# Patient Record
Sex: Male | Born: 1975 | Race: White | Hispanic: No | Smoking: Current every day smoker
Health system: Southern US, Community
[De-identification: ages and names within clinical notes are randomized; demographics above are authoritative.]

## PROBLEM LIST (undated history)

## (undated) DIAGNOSIS — B1920 Unspecified viral hepatitis C without hepatic coma: Secondary | ICD-10-CM

## (undated) DIAGNOSIS — F1920 Other psychoactive substance dependence, uncomplicated: Secondary | ICD-10-CM

## (undated) DIAGNOSIS — A4902 Methicillin resistant Staphylococcus aureus infection, unspecified site: Secondary | ICD-10-CM

## (undated) HISTORY — PX: ABCESS DRAINAGE: SHX399

---

## 2018-03-12 ENCOUNTER — Emergency Department (HOSPITAL_COMMUNITY): Payer: Self-pay

## 2018-03-12 ENCOUNTER — Other Ambulatory Visit: Payer: Self-pay

## 2018-03-12 ENCOUNTER — Encounter (HOSPITAL_COMMUNITY): Payer: Self-pay | Admitting: *Deleted

## 2018-03-12 ENCOUNTER — Emergency Department (HOSPITAL_COMMUNITY)
Admission: EM | Admit: 2018-03-12 | Discharge: 2018-03-12 | Disposition: A | Discharge status: Home / Self Care | Payer: Self-pay | Attending: Emergency Medicine | Admitting: Emergency Medicine

## 2018-03-12 DIAGNOSIS — K047 Periapical abscess without sinus: Secondary | ICD-10-CM | POA: Insufficient documentation

## 2018-03-12 DIAGNOSIS — F17210 Nicotine dependence, cigarettes, uncomplicated: Secondary | ICD-10-CM | POA: Insufficient documentation

## 2018-03-12 DIAGNOSIS — K029 Dental caries, unspecified: Secondary | ICD-10-CM | POA: Insufficient documentation

## 2018-03-12 HISTORY — DX: Other psychoactive substance dependence, uncomplicated: F19.20

## 2018-03-12 HISTORY — DX: Unspecified viral hepatitis C without hepatic coma: B19.20

## 2018-03-12 HISTORY — DX: Methicillin resistant Staphylococcus aureus infection, unspecified site: A49.02

## 2018-03-12 LAB — CBC WITH DIFFERENTIAL/PLATELET
BASOS PCT: 0 %
Basophils Absolute: 0 10*3/uL (ref 0.0–0.1)
EOS ABS: 0.1 10*3/uL (ref 0.0–0.7)
EOS PCT: 1 %
HCT: 39.1 % (ref 39.0–52.0)
Hemoglobin: 12.8 g/dL — ABNORMAL LOW (ref 13.0–17.0)
Lymphocytes Relative: 17 %
Lymphs Abs: 1.9 10*3/uL (ref 0.7–4.0)
MCH: 29.8 pg (ref 26.0–34.0)
MCHC: 32.7 g/dL (ref 30.0–36.0)
MCV: 90.9 fL (ref 78.0–100.0)
MONO ABS: 0.8 10*3/uL (ref 0.1–1.0)
MONOS PCT: 7 %
Neutro Abs: 8.6 10*3/uL — ABNORMAL HIGH (ref 1.7–7.7)
Neutrophils Relative %: 75 %
Platelets: 223 10*3/uL (ref 150–400)
RBC: 4.3 MIL/uL (ref 4.22–5.81)
RDW: 13.5 % (ref 11.5–15.5)
WBC: 11.4 10*3/uL — ABNORMAL HIGH (ref 4.0–10.5)

## 2018-03-12 LAB — COMPREHENSIVE METABOLIC PANEL
ALBUMIN: 3.8 g/dL (ref 3.5–5.0)
ALK PHOS: 64 U/L (ref 38–126)
ALT: 44 U/L (ref 17–63)
AST: 51 U/L — AB (ref 15–41)
Anion gap: 10 (ref 5–15)
BUN: 9 mg/dL (ref 6–20)
CALCIUM: 8.6 mg/dL — AB (ref 8.9–10.3)
CO2: 21 mmol/L — AB (ref 22–32)
CREATININE: 0.88 mg/dL (ref 0.61–1.24)
Chloride: 103 mmol/L (ref 101–111)
GFR calc Af Amer: >60 mL/min (ref >60)
GFR calc non Af Amer: >60 mL/min (ref >60)
GLUCOSE: 99 mg/dL (ref 65–99)
Potassium: 3.2 mmol/L — ABNORMAL LOW (ref 3.5–5.1)
SODIUM: 134 mmol/L — AB (ref 135–145)
Total Bilirubin: 1.5 mg/dL — ABNORMAL HIGH (ref 0.3–1.2)
Total Protein: 7.1 g/dL (ref 6.5–8.1)

## 2018-03-12 LAB — I-STAT CG4 LACTIC ACID, ED: Lactic Acid, Venous: 0.81 mmol/L (ref 0.5–1.9)

## 2018-03-12 MED ORDER — IBUPROFEN 400 MG PO TABS
600.0000 mg | ORAL_TABLET | Freq: Once | ORAL | Status: AC
  Administered 2018-03-12: 20:00:00 600 mg via ORAL
  Filled 2018-03-12: qty 1

## 2018-03-12 MED ORDER — CLINDAMYCIN HCL 150 MG PO CAPS
450.0000 mg | ORAL_CAPSULE | Freq: Once | ORAL | Status: AC
  Administered 2018-03-12: 450 mg via ORAL
  Filled 2018-03-12: qty 3

## 2018-03-12 MED ORDER — CLINDAMYCIN HCL 300 MG PO CAPS: 300.0000 mg | ORAL_CAPSULE | Freq: Three times a day (TID) | ORAL | 0 refills | Status: AC

## 2018-03-12 NOTE — ED Provider Notes (Signed)
[facility] EMERGENCY DEPARTMENT Provider Note   CSN: [id number] Arrival date & time: 03/12/18  1759     History   Chief Complaint Chief Complaint  Patient presents with  . Dental Pain  . Fever    HPI Nathaniel Livingston is a 42 y.o. male.  The history is provided by the patient.  Dental Pain   This is a recurrent problem. The current episode started 2 days ago. The problem occurs constantly. The problem has been gradually worsening. The pain is moderate.  Patient states he has recurring dental infections.  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  He states 3 days ago he pulled out 1 of his teeth because it was infected and now it has spread to other teeth.  Patient primarily complaining of left upper dental pain at this time.  He states he would also like help for substance abuse.  Past Medical History:  Diagnosis Date  . Drug addiction (HCC)   . Hepatitis C   . MRSA (methicillin resistant Staphylococcus aureus)     There are no active problems to display for this patient.   History reviewed. No pertinent surgical history.     Home Medications    Prior to Admission medications   Medication Sig Start Date End Date Taking? Authorizing Provider  clindamycin (CLEOCIN) 300 MG capsule Take 1 capsule (300 mg total) by mouth 3 (three) times daily for 10 days. 03/12/18 03/22/18  Dwana Melena, DO    Family History No family history on file.  Social History Social History   Tobacco Use  . Smoking status: Current Every Day Smoker    Packs/day: 3.00    Types: Cigarettes  . Smokeless tobacco: Never Used  Substance Use Topics  . Alcohol use: Yes  . Drug use: Yes    Types: Methamphetamines, Cocaine, Marijuana     Allergies   Sulfa antibiotics   Review of Systems Review of Systems  Constitutional: Negative for chills and fever.  HENT: Positive for dental problem. Negative for congestion and sore throat.   Eyes: Negative for  pain and visual disturbance.  Respiratory: Negative for cough and shortness of breath.   Cardiovascular: Negative for chest pain and palpitations.  Gastrointestinal: Negative for abdominal pain, nausea and vomiting.  Genitourinary: Negative for dysuria.  Musculoskeletal: Negative for arthralgias and back pain.  Skin: Negative for color change and rash.  Neurological: Negative for headaches.  All other systems reviewed and are negative.    Physical Exam Updated Vital Signs Pulse 97   Temp 98.8 F (37.1 C) (Oral)   Resp 16   Ht 6\' 1"  (1.854 m)   Wt 99.8 kg (220 lb)   SpO2 100%   BMI 29.03 kg/m   Physical Exam  Constitutional: He is oriented to person, place, and time. He appears well-developed and well-nourished.  HENT:  Head: Normocephalic and atraumatic.  Mouth/Throat: Mucous membranes are normal. No trismus in the jaw. Abnormal dentition. Dental abscesses and dental caries present. No uvula swelling. No tonsillar abscesses.    Eyes: Conjunctivae are normal.  Neck: Neck supple.  Cardiovascular: Normal rate and regular rhythm.  Pulmonary/Chest: Effort normal and breath sounds normal.  Abdominal: Soft. There is no tenderness.  Musculoskeletal: He exhibits no edema.  Neurological: He is alert and oriented to person, place, and time.  Skin: Skin is warm and dry.  Psychiatric: He has a normal mood and affect. He is hyperactive.  Nursing note and vitals reviewed.  ED Treatments / Results  Labs (all labs ordered are listed, but only abnormal results are displayed) Labs Reviewed  COMPREHENSIVE METABOLIC PANEL - Abnormal; Notable for the following components:      Result Value   Sodium 134 (*)    Potassium 3.2 (*)    CO2 21 (*)    Calcium 8.6 (*)    AST 51 (*)    Total Bilirubin 1.5 (*)    All other components within normal limits  CBC WITH DIFFERENTIAL/PLATELET - Abnormal; Notable for the following components:   WBC 11.4 (*)    Hemoglobin 12.8 (*)    Neutro Abs  8.6 (*)    All other components within normal limits  URINALYSIS, ROUTINE W REFLEX MICROSCOPIC  I-STAT CG4 LACTIC ACID, ED    EKG  EKG Interpretation None       Radiology No results found.  Procedures Procedures (including critical care time)  Medications Ordered in ED Medications  clindamycin (CLEOCIN) capsule 450 mg (not administered)  ibuprofen (ADVIL,MOTRIN) tablet 600 mg (not administered)     Initial Impression / Assessment and Plan / ED Course  I have reviewed the triage vital signs and the nursing notes.  Pertinent labs & imaging results that were available during my care of the patient were reviewed by me and considered in my medical decision making (see chart for details).     Patient is a 42 year old male who presents with dental pain and fever.  Reported fever of 101 with EMS, however he is afebrile here.  Patient does admit to recent methamphetamine use and does want help with substance abuse.  Patient has recurring dental infections per the patient.  On exam he does have multiple dental caries and is missing several teeth.  He has a mild dental abscess on the left upper incisor.  There is no facial swelling or neck swelling or signs of Ludwig angina.  He has no trismus.    Labs showed leukocytosis of 11.4, mild hyponatremia and hyperkalemia.  No need for intervention at this time.  Doubt sepsis.  Chest x-ray does not show any obvious signs of pneumonia.  No urinary symptoms.  No GI symptoms.  First dose of clindamycin given here for odontogenic infection.  Along with ibuprofen.  10 days of clindamycin prescribed.  Resources for dental clinics around town and substance abuse treatment centers given in discharge paperwork.  Patient to follow-up with dentist as soon as possible.  Patient discharged in good condition.  Doubt sepsis or any other infection at this time.  Final Clinical Impressions(s) / ED Diagnoses   Final diagnoses:  Pain due to dental caries    Dental abscess    ED Discharge Orders        Ordered    clindamycin (CLEOCIN) 300 MG capsule  3 times daily     03/12/18 1924       Dwana MelenaDong, Zakia Sainato, DO 03/12/18 1931    Mesner, Barbara CowerJason, MD 03/12/18 1945

## 2018-03-12 NOTE — ED Notes (Signed)
Pt verbalized understanding of d/c instructions and has no further questions, VSS, NAD.  

## 2018-03-12 NOTE — ED Triage Notes (Signed)
Pt here via [facility] after c/o "feeling terrible".  Pt last used meth yesterday. States pain to legs, nausea, loose teeth, swelling behind ear and below jaw.  Pt given 700 ns and 4 iv zofran en-route.  EMS stated temp 101 temporal, but 98.8 in triage.

## 2018-03-13 ENCOUNTER — Telehealth: Payer: Self-pay | Admitting: *Deleted

## 2018-03-13 NOTE — Telephone Encounter (Signed)
Pt calling stating Rx prescribed is too expensive and he can not afford them.  NCM searched [employer] for an affordable coupon.  NCM text coupon to pt phone and stayed online until it was received.  Pt very appreciative.

## 2018-12-26 ENCOUNTER — Other Ambulatory Visit: Payer: Self-pay

## 2018-12-26 ENCOUNTER — Emergency Department
Admission: EM | Admit: 2018-12-26 | Discharge: 2018-12-27 | Disposition: A | Discharge status: Home / Self Care | Payer: Self-pay | Attending: Emergency Medicine | Admitting: Emergency Medicine

## 2018-12-26 DIAGNOSIS — L7622 Postprocedural hemorrhage and hematoma of skin and subcutaneous tissue following other procedure: Secondary | ICD-10-CM | POA: Insufficient documentation

## 2018-12-26 DIAGNOSIS — F17210 Nicotine dependence, cigarettes, uncomplicated: Secondary | ICD-10-CM | POA: Insufficient documentation

## 2018-12-26 DIAGNOSIS — R58 Hemorrhage, not elsewhere classified: Secondary | ICD-10-CM

## 2018-12-26 NOTE — ED Triage Notes (Signed)
Pt to the er for bleeding to the right axillae post surgery. Pt had a drainage until tonight when it turned to bleeding when he showered.

## 2018-12-27 ENCOUNTER — Emergency Department: Payer: Self-pay

## 2018-12-27 ENCOUNTER — Encounter: Payer: Self-pay | Admitting: Radiology

## 2018-12-27 LAB — CBC
HCT: 43.4 % (ref 39.0–52.0)
HEMOGLOBIN: 14.9 g/dL (ref 13.0–17.0)
MCH: 30.7 pg (ref 26.0–34.0)
MCHC: 34.3 g/dL (ref 30.0–36.0)
MCV: 89.3 fL (ref 80.0–100.0)
NRBC: 0 % (ref 0.0–0.2)
PLATELETS: 355 10*3/uL (ref 150–400)
RBC: 4.86 MIL/uL (ref 4.22–5.81)
RDW: 12.8 % (ref 11.5–15.5)
WBC: 11.2 10*3/uL — ABNORMAL HIGH (ref 4.0–10.5)

## 2018-12-27 LAB — COMPREHENSIVE METABOLIC PANEL
ALK PHOS: 63 U/L (ref 38–126)
ALT: 35 U/L (ref 0–44)
AST: 24 U/L (ref 15–41)
Albumin: 3.7 g/dL (ref 3.5–5.0)
Anion gap: 10 (ref 5–15)
BUN: 15 mg/dL (ref 6–20)
CHLORIDE: 103 mmol/L (ref 98–111)
CO2: 22 mmol/L (ref 22–32)
CREATININE: 0.77 mg/dL (ref 0.61–1.24)
Calcium: 8.9 mg/dL (ref 8.9–10.3)
GFR calc Af Amer: >60 mL/min (ref >60)
Glucose, Bld: 118 mg/dL — ABNORMAL HIGH (ref 70–99)
Potassium: 3.6 mmol/L (ref 3.5–5.1)
Sodium: 135 mmol/L (ref 135–145)
Total Bilirubin: 0.7 mg/dL (ref 0.3–1.2)
Total Protein: 7.8 g/dL (ref 6.5–8.1)

## 2018-12-27 MED ORDER — LIDOCAINE HCL (PF) 1 % IJ SOLN
INTRAMUSCULAR | Status: AC
  Administered 2018-12-27: 5 mL via INTRADERMAL
  Filled 2018-12-27: qty 5

## 2018-12-27 MED ORDER — IOHEXOL 350 MG/ML SOLN
75.0000 mL | Freq: Once | INTRAVENOUS | Status: AC | PRN
  Administered 2018-12-27: 75 mL via INTRAVENOUS

## 2018-12-27 MED ORDER — LIDOCAINE HCL (PF) 1 % IJ SOLN
5.0000 mL | Freq: Once | INTRAMUSCULAR | Status: AC
  Administered 2018-12-27: 5 mL via INTRADERMAL

## 2018-12-27 NOTE — ED Notes (Signed)
Pt verbalizes understanding of discharge instructions.

## 2018-12-27 NOTE — ED Notes (Signed)
Pt to CT

## 2018-12-27 NOTE — ED Notes (Signed)
Pt reports has a headache after having CT done reports pain 5/10, headache is steady "pressure like headache" reports feeling itching sensation in head and arms

## 2018-12-27 NOTE — ED Notes (Signed)
Dr. Brown at bedside

## 2018-12-27 NOTE — ED Notes (Signed)
Pt reports positive for MRSA, reports was admitted at [facility] and had abscess drained to right axilla reports today he took a shower and started to bleed. Pt denies any pain, pt talks in complete sentences no distress noted

## 2018-12-27 NOTE — ED Notes (Signed)
Signature pad did not work in room pt signed paper  

## 2018-12-27 NOTE — ED Notes (Signed)
Bleeding controlled after Dr. Manson Passey applied quick clot

## 2018-12-28 NOTE — ED Provider Notes (Signed)
[facility] Emergency Department Provider Note _____   First MD Initiated Contact with Patient 12/26/18 2359     (approximate)  I have reviewed the triage vital signs and the nursing notes.   HISTORY  Chief Complaint Wound Dehiscence   HPI Nathaniel Livingston is a 43 y.o. male with below list of chronic medical conditions including IV drug use presents to the emergency department with history of being admitted [facility] secondary to right axilla abscess for which I&D was performed by this "surgeons there".  Patient states that he was discharged [facility] yesterday and while taking a shower last night when he raise his right arm above his head he began to bleed from the surgical site.   Past Medical History:  Diagnosis Date  . Drug addiction (HCC)   . Hepatitis C   . MRSA (methicillin resistant Staphylococcus aureus)     There are no active problems to display for this patient.   Past Surgical History:  Procedure Laterality Date  . ABCESS DRAINAGE      Prior to Admission medications   Not on File    Allergies Sulfa antibiotics  History reviewed. No pertinent family history.  Social History Social History   Tobacco Use  . Smoking status: Current Every Day Smoker    Packs/day: 3.00    Types: Cigarettes  . Smokeless tobacco: Never Used  Substance Use Topics  . Alcohol use: Yes  . Drug use: Yes    Types: Methamphetamines    Review of Systems Constitutional: No fever/chills Eyes: No visual changes. ENT: No sore throat. Cardiovascular: Denies chest pain. Respiratory: Denies shortness of breath. Gastrointestinal: No abdominal pain.  No nausea, no vomiting.  No diarrhea.  No constipation. Genitourinary: Negative for dysuria. Musculoskeletal: Negative for neck pain.  Negative for back pain.  Bleeding from surgical site right axilla Integumentary: Negative for rash. Neurological: Negative for headaches, focal weakness or  numbness.   ____________________________________________   PHYSICAL EXAM:  VITAL SIGNS: ED Triage Vitals  Enc Vitals Group     BP 12/26/18 2233 (!) 157/98     Pulse Rate 12/26/18 2233 91     Resp 12/26/18 2233 16     Temp 12/26/18 2233 98.5 F (36.9 C)     Temp Source 12/26/18 2233 Oral     SpO2 12/26/18 2233 98 %     Weight 12/26/18 2233 108.9 kg (240 lb)     Height 12/26/18 2233 1.854 m (6\' 1" )     Head Circumference --      Peak Flow --      Pain Score 12/26/18 2241 0     Pain Loc --      Pain Edu? --      Excl. in GC? --     Constitutional: Alert and oriented. Well appearing and in no acute distress. Eyes: Conjunctivae are normal.  Mouth/Throat: Mucous membranes are moist.  Oropharynx non-erythematous. Neck: No stridor.   Cardiovascular: Normal rate, regular rhythm. Good peripheral circulation. Grossly normal heart sounds. Respiratory: Normal respiratory effort.  No retractions. Lungs CTAB. Gastrointestinal: Soft and nontender. No distention.    Musculoskeletal: No lower extremity tenderness nor edema. No gross deformities of extremities. Neurologic:  Normal speech and language. No gross focal neurologic deficits are appreciated.  Skin: 2 open wounds noted right axilla consistent with history of I&D superior wound with active bleeding which appears venous in nature.  Moderate bleeding noted Psychiatric: Mood and affect are normal.  Speech and behavior are normal.  ____________________________________________   LABS (all labs ordered are listed, but only abnormal results are displayed)  Labs Reviewed  CBC - Abnormal; Notable for the following components:      Result Value   WBC 11.2 (*)    All other components within normal limits  COMPREHENSIVE METABOLIC PANEL - Abnormal; Notable for the following components:   Glucose, Bld 118 (*)    All other components within normal limits    RADIOLOGY {**I, [place] N BROWN, personally viewed and evaluated these images  (plain radiographs) as part of my medical decision making, as well as reviewing the written report by the radiologist.     ..Laceration Repair Date/Time: 12/28/2018 7:31 AM Performed by: Darci CurrentBrown, [place] N, MD Authorized by: Darci CurrentBrown, [place] N, MD   Consent:    Consent obtained:  Verbal   Consent given by:  Patient   Risks discussed:  Infection, pain, retained foreign body, poor cosmetic result and poor wound healing Anesthesia (see MAR for exact dosages):    Anesthesia method:  Local infiltration   Local anesthetic:  Lidocaine 1% w/o epi Laceration details:    Location:  Shoulder/arm (Right axilla) Repair type:    Repair type:  Simple Exploration:    Hemostasis achieved with:  Direct pressure   Wound exploration: entire depth of wound probed and visualized     Contaminated: no   Treatment:    Area cleansed with:  Saline   Amount of cleaning:  Extensive   Irrigation solution:  Sterile saline   Visualized foreign bodies/material removed: no   Skin repair:    Repair method:  Sutures   Suture size:  5-0   Suture material:  Fast-absorbing gut   Number of sutures:  2 Approximation:    Approximation:  Close Post-procedure details:    Dressing:  Sterile dressing   Patient tolerance of procedure:  Tolerated well, no immediate complications     ____________________________________________   INITIAL IMPRESSION / ASSESSMENT AND PLAN / ED COURSE  As part of my medical decision making, I reviewed the following data within the electronic MEDICAL RECORD NUMBER  Direct pressure was applied to the wound however patient continued to bleed and as such CT angios performed to evaluate for underlying vascular injury.  CT angiogram revealed no vascular damage however foci of air noted which I suspect to be secondary to the open wounds that the patient has.  Culture results were reviewed from [facility]. Patient's axilla wound that is actively bleeding was cleaned with Betadine and subsequently  anesthetized with 1% lidocaine without epinephrine.  2 Vicryl stitches were introduced to obtain hemostasis subsequently Surgicel was applied to the wound with bleeding resolved.  Patient advised to follow-up with surgeon at [facility] today     ____________________________________________  FINAL CLINICAL IMPRESSION(S) / ED DIAGNOSES  Final diagnoses:  Bleeding  Postoperative bleeding   MEDICATIONS GIVEN DURING THIS VISIT:  Medications  lidocaine (PF) (XYLOCAINE) 1 % injection 5 mL (5 mLs Intradermal Given 12/27/18 0200)  iohexol (OMNIPAQUE) 350 MG/ML injection 75 mL (75 mLs Intravenous Contrast Given 12/27/18 0315)     ED Discharge Orders    None       Note:  This document was prepared using Dragon voice recognition software and may include unintentional dictation errors.    Darci CurrentBrown, [place] N, MD 12/28/18 [phone]

## 2020-05-03 IMAGING — CT CT ANGIO CHEST
2 of 6 series · 18 of 46 positions shown · IV contrast (APPLIED)
Comparison: None.

CLINICAL DATA: Status post right axillary abscess surgery 5 days
ago, with bleeding from the site. Initial encounter.

EXAM:
CT ANGIOGRAPHY CHEST WITH CONTRAST
TECHNIQUE: Multidetector CT imaging of the chest was performed using the
standard protocol during bolus administration of intravenous
contrast. Multiplanar CT image reconstructions and MIPs were
obtained to evaluate the vascular anatomy.
CONTRAST:  75mL OMNIPAQUE IOHEXOL 350 MG/ML SOLN

[Series 5: thins · axial · 0.83mm/px · z∈[-320,-47]mm · 15 of 299 slices shown]
[im 13/299  lung]
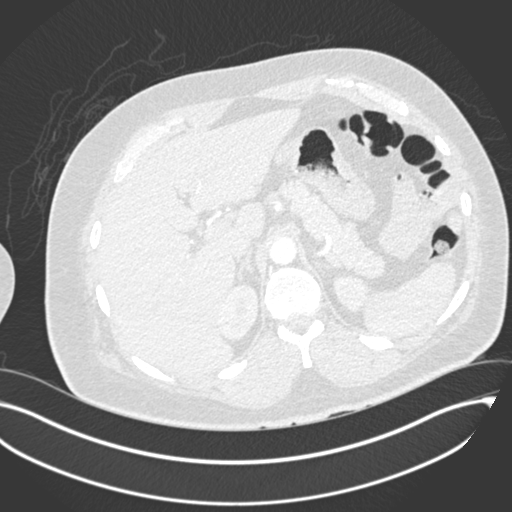
[im 39/299  soft-tissue]
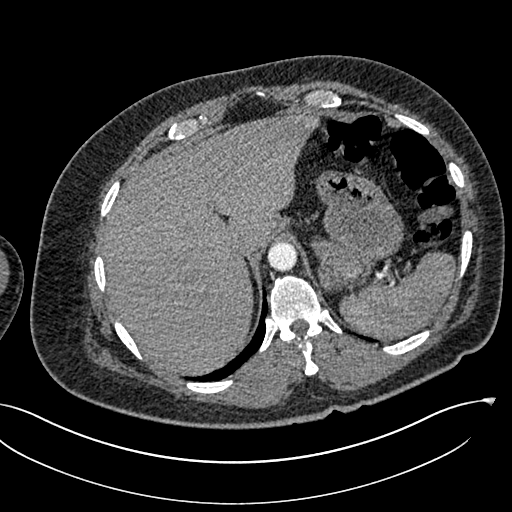
[im 52/299  lung]
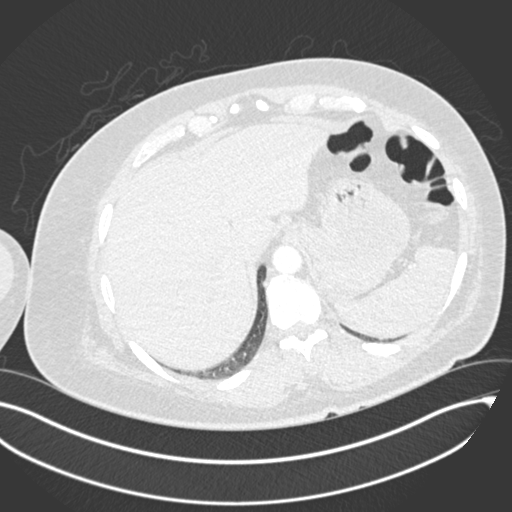
[im 78/299  soft-tissue]
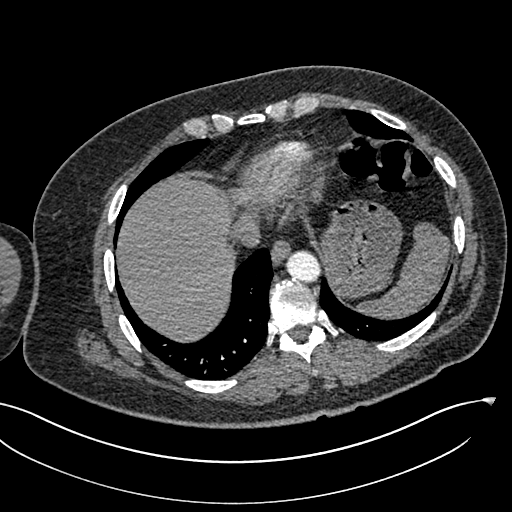
[im 91/299  lung]
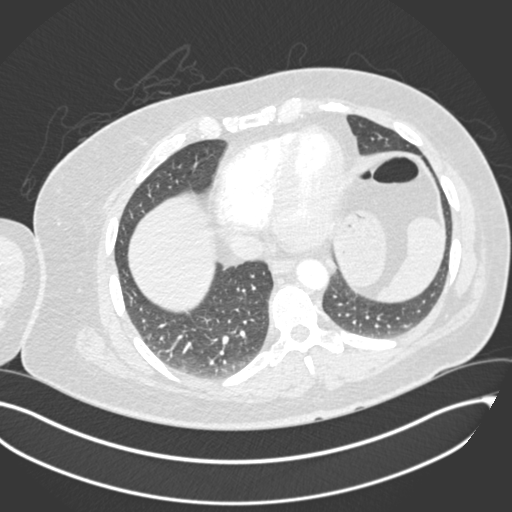
[im 117/299  soft-tissue]
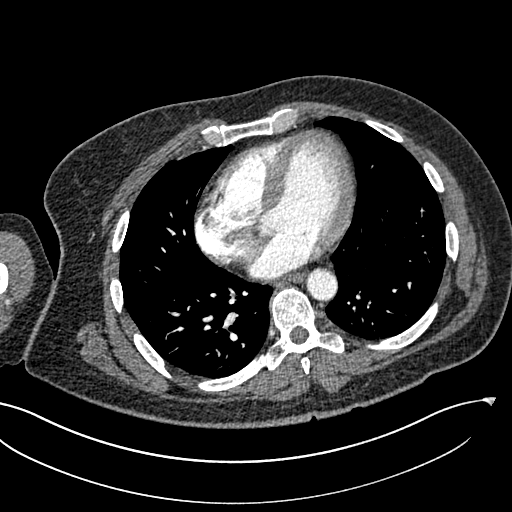
[im 130/299  lung]
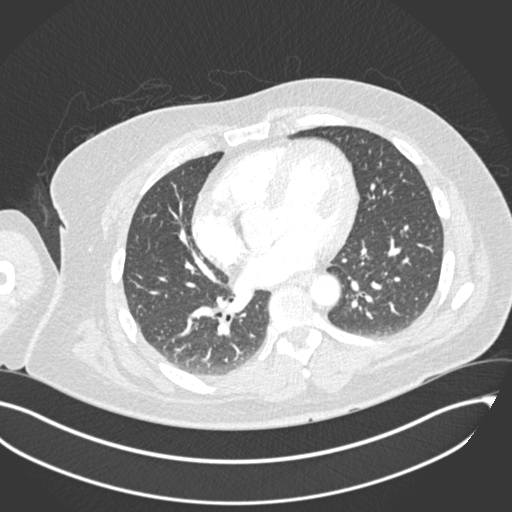
[im 156/299  soft-tissue]
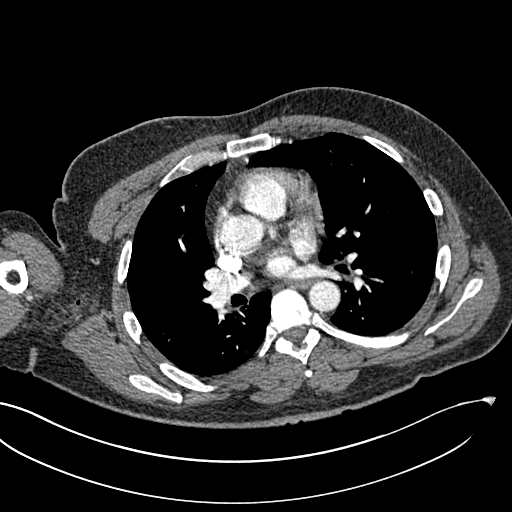
[im 169/299  lung]
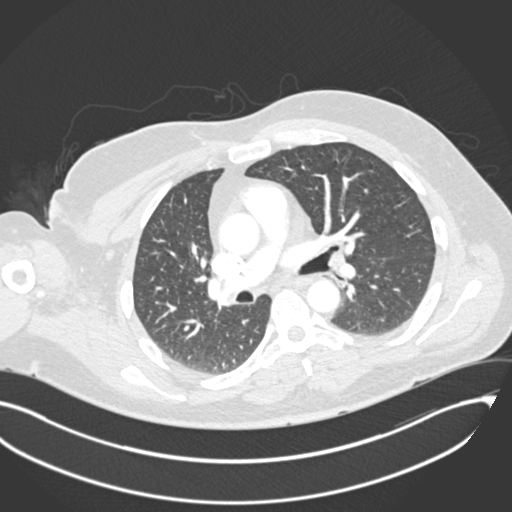
[im 182/299  soft-tissue]
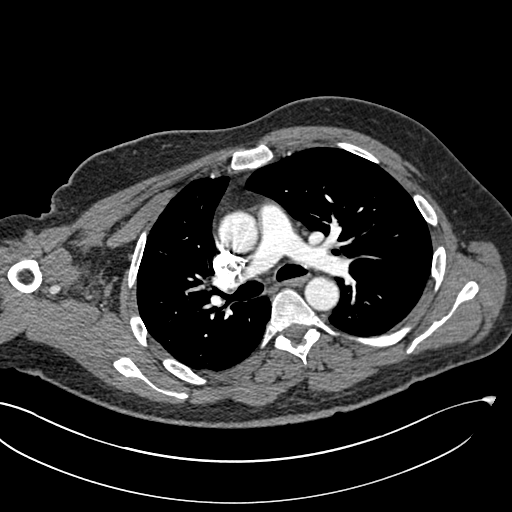
[im 208/299  lung]
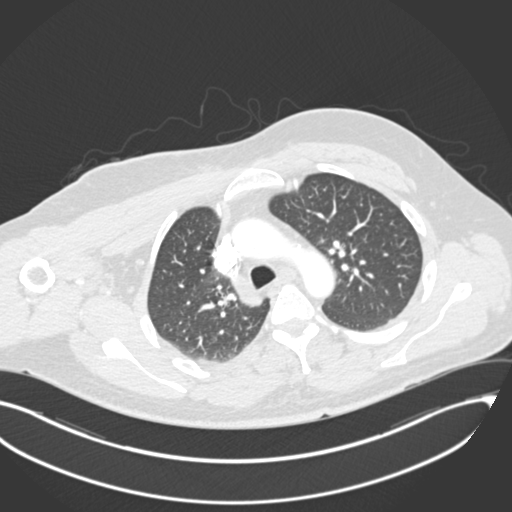
[im 221/299  soft-tissue]
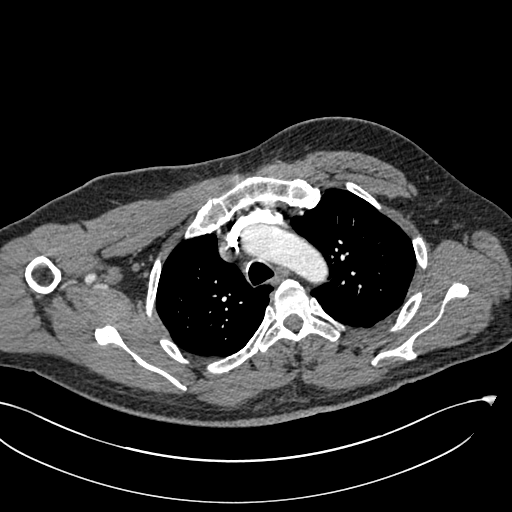
[im 247/299  lung]
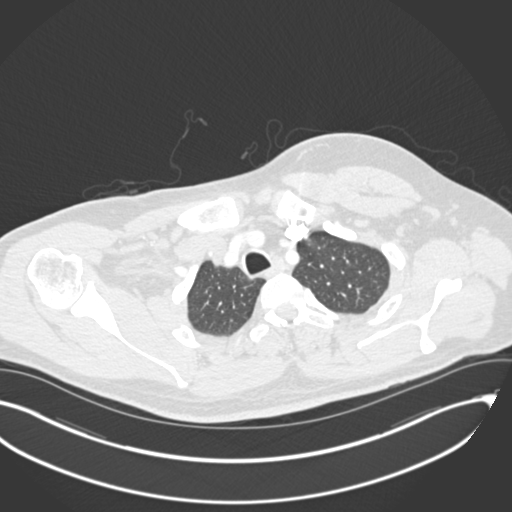
[im 260/299  soft-tissue]
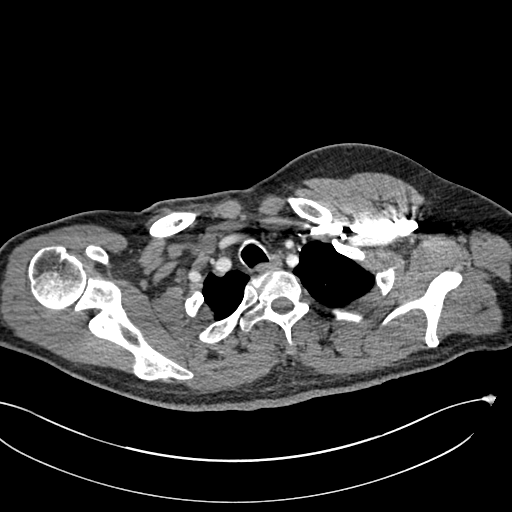
[im 286/299  lung]
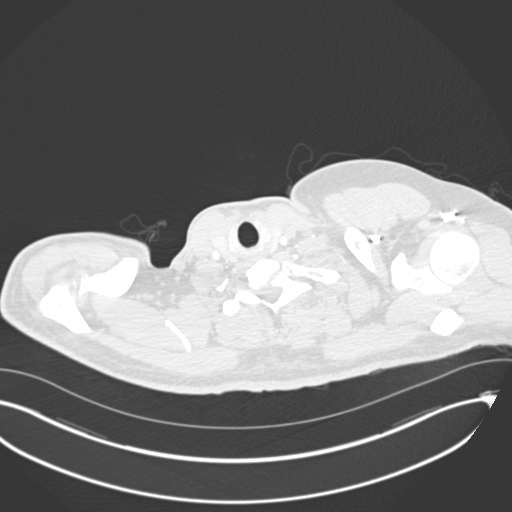

[Series 7: coronal mpr · coronal · 0.58mm/px · 3 of 89 slices shown]
[im 23/89  soft-tissue]
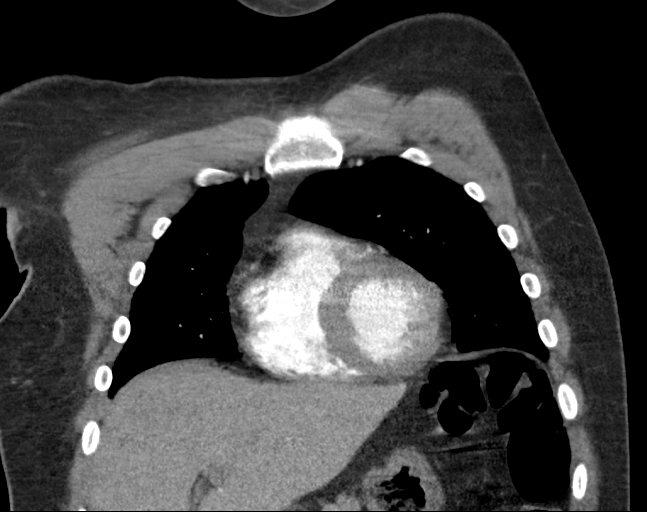
[im 45/89  soft-tissue]
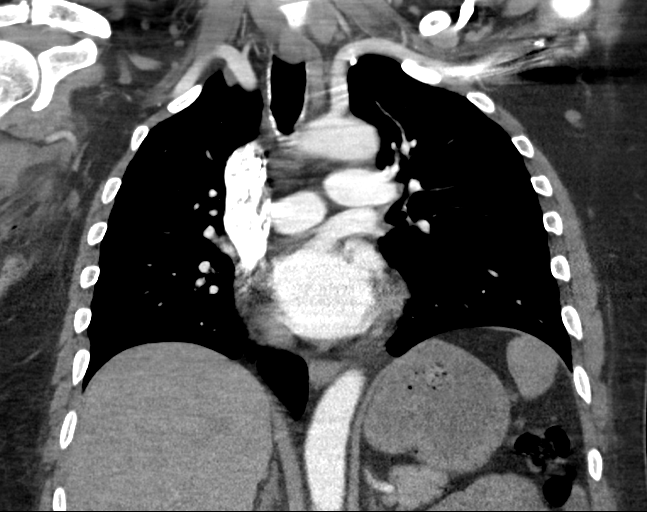
[im 67/89  soft-tissue]
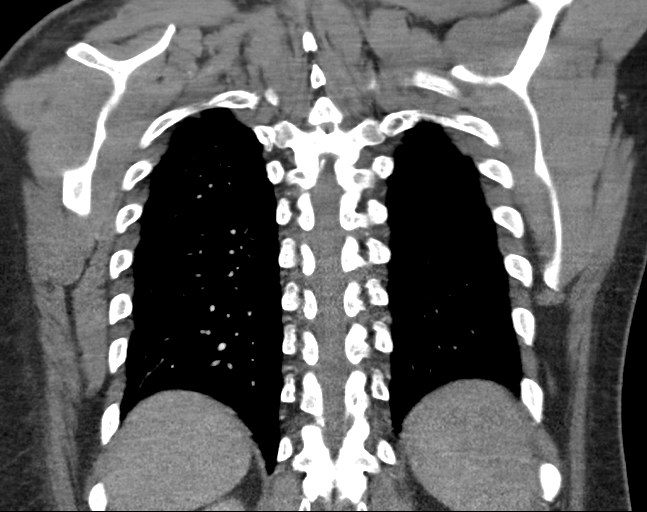

[18 of 46 positions shown; findings below may reference images not displayed]

FINDINGS: Cardiovascular:  There is no evidence of pulmonary embolus.

The heart is normal in size. The thoracic aorta is grossly
unremarkable. The great vessels are within normal limits.

Mediastinum/Nodes: The mediastinum is unremarkable in appearance. No
mediastinal lymphadenopathy is seen. No pericardial effusion is
identified. The visualized portions of the thyroid gland are
unremarkable. No axillary lymphadenopathy is seen.

A few scattered foci of air are seen within the soft tissues at the
right axilla, somewhat more than would be expected 5 days status
post surgery. Surrounding soft tissue inflammation is noted. No well
defined abscess is seen. Would correlate clinically for evidence of
infection with a gas producing organism.

Lungs/Pleura: The lungs are clear bilaterally. No focal
consolidation, pleural effusion or pneumothorax is seen. No masses
are identified.

Upper Abdomen: The visualized portions of the liver and spleen are
unremarkable. The visualized portions of the pancreas, adrenal
glands and kidneys are within normal limits.

Musculoskeletal: No acute osseous abnormalities are identified. The
visualized musculature is unremarkable in appearance.

Review of the MIP images confirms the above findings.
IMPRESSION: 1. No evidence of pulmonary embolus.
2. Few scattered foci of air within the soft tissues at the right
axilla, somewhat more than would be expected 5 days status post
surgery. Surrounding soft tissue inflammation noted. No well defined
abscess seen. Would correlate clinically for evidence of infection
with a gas producing organism.
3. Lungs clear bilaterally.

## 2020-11-03 ENCOUNTER — Emergency Department: Payer: Self-pay

## 2020-11-03 ENCOUNTER — Encounter: Payer: Self-pay | Admitting: Emergency Medicine

## 2020-11-03 ENCOUNTER — Other Ambulatory Visit: Payer: Self-pay

## 2020-11-03 DIAGNOSIS — Z23 Encounter for immunization: Secondary | ICD-10-CM | POA: Insufficient documentation

## 2020-11-03 DIAGNOSIS — F17210 Nicotine dependence, cigarettes, uncomplicated: Secondary | ICD-10-CM | POA: Insufficient documentation

## 2020-11-03 DIAGNOSIS — Y9289 Other specified places as the place of occurrence of the external cause: Secondary | ICD-10-CM | POA: Insufficient documentation

## 2020-11-03 DIAGNOSIS — Z8614 Personal history of Methicillin resistant Staphylococcus aureus infection: Secondary | ICD-10-CM | POA: Insufficient documentation

## 2020-11-03 DIAGNOSIS — S6701XA Crushing injury of right thumb, initial encounter: Secondary | ICD-10-CM | POA: Insufficient documentation

## 2020-11-03 DIAGNOSIS — W230XXA Caught, crushed, jammed, or pinched between moving objects, initial encounter: Secondary | ICD-10-CM | POA: Insufficient documentation

## 2020-11-03 DIAGNOSIS — S61011A Laceration without foreign body of right thumb without damage to nail, initial encounter: Secondary | ICD-10-CM | POA: Insufficient documentation

## 2020-11-03 DIAGNOSIS — Y999 Unspecified external cause status: Secondary | ICD-10-CM | POA: Insufficient documentation

## 2020-11-03 DIAGNOSIS — Y9389 Activity, other specified: Secondary | ICD-10-CM | POA: Insufficient documentation
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
## 2020-11-04 ENCOUNTER — Emergency Department
Admission: EM | Admit: 2020-11-04 | Discharge: 2020-11-04 | Disposition: A | Discharge status: Home / Self Care | Payer: Self-pay | Attending: Emergency Medicine | Admitting: Emergency Medicine

## 2020-11-04 DIAGNOSIS — S61011A Laceration without foreign body of right thumb without damage to nail, initial encounter: Secondary | ICD-10-CM

## 2020-11-04 DIAGNOSIS — S6710XA Crushing injury of unspecified finger(s), initial encounter: Secondary | ICD-10-CM

## 2020-11-04 MED ORDER — DOXYCYCLINE HYCLATE 100 MG PO TABS
100.0000 mg | ORAL_TABLET | Freq: Once | ORAL | Status: AC
  Administered 2020-11-04: 100 mg via ORAL
  Filled 2020-11-04: qty 1

## 2020-11-04 MED ORDER — CEPHALEXIN 500 MG PO CAPS
500.0000 mg | ORAL_CAPSULE | Freq: Once | ORAL | Status: AC
  Administered 2020-11-04: 500 mg via ORAL
  Filled 2020-11-04: qty 1

## 2020-11-04 MED ORDER — DOXYCYCLINE HYCLATE 100 MG PO CAPS: 100.0000 mg | ORAL_CAPSULE | Freq: Two times a day (BID) | ORAL | 0 refills | Status: AC

## 2020-11-04 MED ORDER — HYDROCODONE-ACETAMINOPHEN 5-325 MG PO TABS: 2.0000 | ORAL_TABLET | Freq: Four times a day (QID) | ORAL | 0 refills | Status: AC | PRN

## 2020-11-04 MED ORDER — LIDOCAINE HCL (PF) 1 % IJ SOLN
5.0000 mL | Freq: Once | INTRAMUSCULAR | Status: AC
  Administered 2020-11-04: 5 mL
  Filled 2020-11-04: qty 5

## 2020-11-04 MED ORDER — OXYCODONE-ACETAMINOPHEN 5-325 MG PO TABS
2.0000 | ORAL_TABLET | Freq: Once | ORAL | Status: AC
  Administered 2020-11-04: 2 via ORAL
  Filled 2020-11-04: qty 2

## 2020-11-04 MED ORDER — TETANUS-DIPHTH-ACELL PERTUSSIS 5-2.5-18.5 LF-MCG/0.5 IM SUSY
0.5000 mL | PREFILLED_SYRINGE | Freq: Once | INTRAMUSCULAR | Status: AC
  Administered 2020-11-04: 0.5 mL via INTRAMUSCULAR
  Filled 2020-11-04: qty 0.5

## 2020-11-04 MED ORDER — CEPHALEXIN 500 MG PO CAPS: 500.0000 mg | ORAL_CAPSULE | Freq: Four times a day (QID) | ORAL | 0 refills | Status: AC

## 2020-11-04 NOTE — Discharge Instructions (Signed)
As we discussed, given the nature of your wounds, we left the lacerations to your thumb open and provided a splint for comfort and protection of the thumb.  It is important that you contact orthopedic surgery, and if possible, Dr. Stephenie Acres (the local hand specialist), to schedule a follow-up appointment at the next available opportunity.  Keep your splint clean and dry.  Take the prescribed course of antibiotics unless told to stop by another physician.  Use over-the-counter ibuprofen and/or Tylenol as needed for pain control. Take Norco as prescribed for severe pain. Do not drink alcohol, drive or participate in any other potentially dangerous activities while taking this medication as it may make you sleepy. Do not take this medication with any other sedating medications, either prescription or over-the-counter. If you were prescribed Percocet or Vicodin, do not take these with acetaminophen (Tylenol) as it is already contained within these medications.   This medication is an opiate (or narcotic) pain medication and can be habit forming.  Use it as little as possible to achieve adequate pain control.  Do not use or use it with extreme caution if you have a history of opiate abuse or dependence.  If you are on a pain contract with your primary care doctor or a pain specialist, be sure to let them know you were prescribed this medication today from the [facility] Emergency Department.  This medication is intended for your use only - do not give any to anyone else and keep it in a secure place where nobody else, especially children, have access to it.  It will also cause or worsen constipation, so you may want to consider taking an over-the-counter stool softener while you are taking this medication.    Return to the emergency department if you develop new or worsening symptoms that concern you.
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
## 2020-11-04 NOTE — ED Notes (Signed)
Right thumb wound cleaned out with NS and hand placed in bin with mixture of NS and betadine.
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
